# Patient Record
Sex: Male | Born: 1975
Health system: Southern US, Community
[De-identification: ages and names within clinical notes are randomized; demographics above are authoritative.]

## PROBLEM LIST (undated history)

## (undated) DIAGNOSIS — Z973 Presence of spectacles and contact lenses: Secondary | ICD-10-CM

## (undated) DIAGNOSIS — Z87898 Personal history of other specified conditions: Secondary | ICD-10-CM

## (undated) HISTORY — DX: Personal history of other specified conditions: Z87.898

## (undated) HISTORY — DX: Presence of spectacles and contact lenses: Z97.3

---
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
## 2011-10-28 ENCOUNTER — Ambulatory Visit (INDEPENDENT_AMBULATORY_CARE_PROVIDER_SITE_OTHER): Payer: 59 | Admitting: Medical

## 2011-10-28 ENCOUNTER — Encounter: Payer: Self-pay | Admitting: Medical

## 2011-10-28 VITALS — BP 118/80 | HR 60 | Temp 98.0°F | Resp 18 | Ht 73.5 in | Wt 202.0 lb

## 2011-10-28 DIAGNOSIS — Z0000 Encounter for general adult medical examination without abnormal findings: Secondary | ICD-10-CM

## 2011-10-28 DIAGNOSIS — Z8249 Family history of ischemic heart disease and other diseases of the circulatory system: Secondary | ICD-10-CM

## 2011-10-28 DIAGNOSIS — M25512 Pain in left shoulder: Secondary | ICD-10-CM

## 2011-10-28 DIAGNOSIS — M7660 Achilles tendinitis, unspecified leg: Secondary | ICD-10-CM

## 2011-10-28 DIAGNOSIS — M25519 Pain in unspecified shoulder: Secondary | ICD-10-CM

## 2011-10-28 LAB — LIPID PANEL
LDL Cholesterol: 143 mg/dL — ABNORMAL HIGH (ref 0–99)
Total CHOL/HDL Ratio: 3.3 Ratio
Triglycerides: 58 mg/dL (ref <150)
VLDL: 12 mg/dL (ref 0–40)

## 2011-10-28 LAB — COMPREHENSIVE METABOLIC PANEL
AST: 28 U/L (ref 0–37)
Alkaline Phosphatase: 41 U/L (ref 39–117)
BUN: 10 mg/dL (ref 6–23)
Creat: 0.98 mg/dL (ref 0.50–1.35)

## 2011-10-28 LAB — POCT URINALYSIS DIPSTICK
Blood, UA: NEGATIVE
Glucose, UA: NEGATIVE
Nitrite, UA: NEGATIVE
Protein, UA: NEGATIVE
Urobilinogen, UA: NEGATIVE
pH, UA: 7

## 2011-10-28 LAB — CBC WITH DIFFERENTIAL/PLATELET
Basophils Relative: 1 % (ref 0–1)
Eosinophils Absolute: 0.4 10*3/uL (ref 0.0–0.7)
HCT: 43.9 % (ref 39.0–52.0)
Hemoglobin: 15.6 g/dL (ref 13.0–17.0)
Lymphs Abs: 2.8 10*3/uL (ref 0.7–4.0)
MCH: 30.2 pg (ref 26.0–34.0)
MCHC: 35.5 g/dL (ref 30.0–36.0)
Monocytes Absolute: 0.7 10*3/uL (ref 0.1–1.0)
Monocytes Relative: 13 % — ABNORMAL HIGH (ref 3–12)
Neutro Abs: 1.9 10*3/uL (ref 1.7–7.7)

## 2011-10-28 NOTE — Progress Notes (Signed)
Subjective:   HPI  Walter Carter is a 36 y.o. male who presents for a complete physical.  New patient today.  He recently graduated form nursing school, working at [employer].  Been in good health, but having issues with left shoulder and achilles.   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  Saw ortho, had injection of steroid and PT.   Using home PT exercises now, but still having problems. Denies fall, trauma or injury.  He also has left achilles pain.   Recently started running 1 mi several days per week, but every time he goes to 2 miles or increases, gets pain.    He has family history of heart disease, wants things checked out.   Reviewed their medical, surgical, family, social, medication, and allergy history and updated chart as appropriate.  Past Medical History  Diagnosis Date  . Wears glasses     alternates some with contacts    Past Surgical History  Procedure Date  . Knee surgery 1988    right for patella fracture and osteophytic growths  . Knee surgery 1991    right knee to remove hardware from prior surgery    Family History  Problem Relation Age of Onset  . Hypertension Mother   . Heart disease Mother 82    ASD, stenting  . Heart disease Maternal Uncle   . Stroke Maternal Grandmother   . Cancer Maternal Grandmother     breast  . Diabetes Maternal Grandfather   . Heart disease Maternal Grandfather   . Dementia Paternal Grandmother     History   Social History  . Marital Status: Divorced    Spouse Name: N/A    Number of Children: N/A  . Years of Education: N/A   Occupational History  . nurse [employer]   Social History Main Topics  . Smoking status: Never Smoker   . Smokeless tobacco: Not on file  . Alcohol Use: 0.6 oz/week    1 Cans of beer per week  . Drug Use: No  . Sexually Active: Not on file   Other Topics Concern   . Not on file   Social History Narrative   Graduated 2013 [employer] for nursing.  Prior worked in group homes.  Single, divorced, has girlfriend x 8 years, has 4 children    No current outpatient prescriptions on file prior to visit.    No Known Allergies    Review of Systems Constitutional: -fever, -chills, -sweats, -unexpected weight change, -anorexia, -fatigue Allergy: -sneezing, -itching, -congestion Dermatology: denies changing moles, rash, lumps, new worrisome lesions ENT: -runny nose, -ear pain, -sore throat, -hoarseness, -sinus pain, -teeth pain, -tinnitus, -hearing loss, -epistaxis Cardiology:  -chest pain, -palpitations, -edema, -orthopnea, -paroxysmal nocturnal dyspnea Respiratory: -cough, -shortness of breath, -dyspnea on exertion, -wheezing, -hemoptysis Gastroenterology: -abdominal pain, -nausea, -vomiting, -diarrhea, -constipation, -blood in stool, -changes in bowel movement, -dysphagia Hematology: -bleeding or bruising problems Musculoskeletal: -arthralgias, +myalgias, -joint swelling, -back pain, -neck pain, -cramping, -gait changes Ophthalmology: -vision changes, -eye redness, -itching, -discharge Urology: -dysuria, -difficulty urinating, -hematuria, -urinary frequency, -urgency, incontinence Neurology: -headache, -weakness, -tingling, -numbness, -speech abnormality, -memory loss, -falls, -dizziness Psychology:  -depressed mood, -agitation, -sleep problems         Objective:   Physical Exam  Filed Vitals:   10/28/11 1054  BP: 118/80  Pulse: 60  Temp: 98  F (36.7 C)  Resp: 18    General appearance: alert, no distress, WD/WN, tall AA male Skin: scattered benign appearing macules, no worrisome lesions HEENT: normocephalic, conjunctiva/corneas normal, sclerae anicteric, PERRLA, EOMi, nares patent, no discharge or erythema, pharynx normal Oral cavity: MMM, tongue normal, missing some molars, otherwise teeth in good repair Neck: supple, no lymphadenopathy, no  thyromegaly, no masses, normal ROM, on bruits Chest: non tender, normal shape and expansion Heart: RRR, normal S1, S2, no murmurs Lungs: CTA bilaterally, no wheezes, rhonchi, or rales Abdomen: +bs, soft, non tender, non distended, no masses, no hepatomegaly, no splenomegaly, no bruits Back: non tender, normal ROM, no scoliosis Musculoskeletal:horizontal surgical scare over right knee, infrapatella, left shoulder with mild pain with resisted empty can and hawkins and apprehension test, but otherwise upper extremities non tender, no obvious deformity, normal ROM throughout, lower extremities non tender, no obvious deformity, normal ROM throughout Extremities: no edema, no cyanosis, no clubbing Pulses: 2+ symmetric, upper and lower extremities, normal cap refill Neurological: alert, oriented x 3, CN2-12 intact, strength normal upper extremities and lower extremities, sensation normal throughout, DTRs 2+ throughout, no cerebellar signs, gait normal Psychiatric: normal affect, behavior normal, pleasant  GU: normal male external genitalia, nontender, no masses, no hernia, no lymphadenopathy Rectal: deferred   Adult ECG Report  Indication: family history of heart disease, physical  Rate: 66bpm  Rhythm: normal sinus rhythm  QRS Axis: 71 degrees  PR Interval:  QRS Duration: 86ms  QTc:  Conduction Disturbances: none  Other Abnormalities: none  Patient's cardiac risk factors are: family history of premature cardiovascular disease and male gender.  EKG comparison: none  Narrative Interpretation: normal EKG    Assessment and Plan :    Encounter Diagnoses  Name Primary?  . Routine general medical examination at a health care facility Yes  . Family history of coronary artery disease   . Shoulder pain, left   . Achilles tendinitis      Physical exam - discussed healthy lifestyle, diet, exercise, preventative care, vaccinations, and addressed their concerns.  He will get influenza  vaccine through work, Tdap was within last 3 years.  Family hx/o CAD - EKG today, labs today for baseline evaluation of risks.  He requested EKG today.  Shoulder pain - etiology unclear, possible rotator cuff issue.  C/t exercises as therapy showed him, and f/u with orthopedics if not improving.   Achilles tendonitis - Aleve prn, use gradual increase in exercise and running instead of big changes in duration of exercise, ice and elevation prn.   Let me know if not improving.  Follow-up pending labs

## 2011-10-28 NOTE — Patient Instructions (Addendum)
Preventative Care for Adults, Male       REGULAR HEALTH EXAMS:  A routine yearly physical is a good way to check in with your primary care provider about your health and preventive screening. It is also an opportunity to share updates about your health and any concerns you have, and receive a thorough all-over exam.   Most health insurance companies pay for at least some preventative services.  Check with your health plan for specific coverages.  WHAT PREVENTATIVE SERVICES DO MEN NEED?  Adult men should have their weight and blood pressure checked regularly.   Men age 35 and older should have their cholesterol levels checked regularly.  Beginning at age 50 and continuing to age 75, men should be screened for colorectal cancer.  Certain people should may need continued testing until age 85.  Other cancer screening may include exams for testicular and prostate cancer.  Updating vaccinations is part of preventative care.  Vaccinations help protect against diseases such as the flu.  Lab tests are generally done as part of preventative care to screen for anemia and blood disorders, to screen for problems with the kidneys and liver, to screen for bladder problems, to check blood sugar, and to check your cholesterol level.  Preventative services generally include counseling about diet, exercise, avoiding tobacco, drugs, excessive alcohol consumption, and sexually transmitted infections.    GENERAL RECOMMENDATIONS FOR GOOD HEALTH:  Healthy diet:  Eat a variety of foods, including fruit, vegetables, animal or vegetable protein, such as meat, fish, chicken, and eggs, or beans, lentils, tofu, and grains, such as rice.  Drink plenty of water daily.  Decrease saturated fat in the diet, avoid lots of red meat, processed foods, sweets, fast foods, and fried foods.  Exercise:  Aerobic exercise helps maintain good heart health. At least 30-40 minutes of moderate-intensity exercise is recommended.  For example, a brisk walk that increases your heart rate and breathing. This should be done on most days of the week.   Find a type of exercise or a variety of exercises that you enjoy so that it becomes a part of your daily life.  Examples are running, walking, swimming, water aerobics, and biking.  For motivation and support, explore group exercise such as aerobic class, spin class, Zumba, Yoga,or  martial arts, etc.    Set exercise goals for yourself, such as a certain weight goal, walk or run in a race such as a 5k walk/run.  Speak to your primary care provider about exercise goals.  Disease prevention:  If you smoke or chew tobacco, find out from your caregiver how to quit. It can literally save your life, no matter how long you have been a tobacco user. If you do not use tobacco, never begin.   Maintain a healthy diet and normal weight. Increased weight leads to problems with blood pressure and diabetes.   The Body Mass Index or BMI is a way of measuring how much of your body is fat. Having a BMI above 27 increases the risk of heart disease, diabetes, hypertension, stroke and other problems related to obesity. Your caregiver can help determine your BMI and based on it develop an exercise and dietary program to help you achieve or maintain this important measurement at a healthful level.  High blood pressure causes heart and blood vessel problems.  Persistent high blood pressure should be treated with medicine if weight loss and exercise do not work.   Fat and cholesterol leaves deposits in your arteries   that can block them. This causes heart disease and vessel disease elsewhere in your body.  If your cholesterol is found to be high, or if you have heart disease or certain other medical conditions, then you may need to have your cholesterol monitored frequently and be treated with medication.   Ask if you should have a stress test if your history suggests this. A stress test is a test done on  a treadmill that looks for heart disease. This test can find disease prior to there being a problem.  Avoid drinking alcohol in excess (more than two drinks per day).  Avoid use of street drugs. Do not share needles with anyone. Ask for professional help if you need assistance or instructions on stopping the use of alcohol, cigarettes, and/or drugs.  Brush your teeth twice a day with fluoride toothpaste, and floss once a day. Good oral hygiene prevents tooth decay and gum disease. The problems can be painful, unattractive, and can cause other health problems. Visit your dentist for a routine oral and dental check up and preventive care every 6-12 months.   Look at your skin regularly.  Use a mirror to look at your back. Notify your caregivers of changes in moles, especially if there are changes in shapes, colors, a size larger than a pencil eraser, an irregular border, or development of new moles.  Safety:  Use seatbelts 100% of the time, whether driving or as a passenger.  Use safety devices such as hearing protection if you work in environments with loud noise or significant background noise.  Use safety glasses when doing any work that could send debris in to the eyes.  Use a helmet if you ride a bike or motorcycle.  Use appropriate safety gear for contact sports.  Talk to your caregiver about gun safety.  Use sunscreen with a SPF (or skin protection factor) of 15 or greater.  Lighter skinned people are at a greater risk of skin cancer. Don't forget to also wear sunglasses in order to protect your eyes from too much damaging sunlight. Damaging sunlight can accelerate cataract formation.   Practice safe sex. Use condoms. Condoms are used for birth control and to help reduce the spread of sexually transmitted infections (or STIs).  Some of the STIs are gonorrhea (the clap), chlamydia, syphilis, trichomonas, herpes, HPV (human papilloma virus) and HIV (human immunodeficiency virus) which causes AIDS.  The herpes, HIV and HPV are viral illnesses that have no cure. These can result in disability, cancer and death.   Keep carbon monoxide and smoke detectors in your home functioning at all times. Change the batteries every 6 months or use a model that plugs into the wall.   Vaccinations:  Stay up to date with your tetanus shots and other required immunizations. You should have a booster for tetanus every 10 years. Be sure to get your flu shot every year, since 5%-20% of the [place] population comes down with the flu. The flu vaccine changes each year, so being vaccinated once is not enough. Get your shot in the fall, before the flu season peaks.   Other vaccines to consider:  Pneumococcal vaccine to protect against certain types of pneumonia.  This is normally recommended for adults age 65 or older.  However, adults younger than 36 years old with certain underlying conditions such as diabetes, heart or lung disease should also receive the vaccine.  Shingles vaccine to protect against Varicella Zoster if you are older than age 60, or younger   than 36 years old with certain underlying illness.  Hepatitis A vaccine to protect against a form of infection of the liver by a virus acquired from food.  Hepatitis B vaccine to protect against a form of infection of the liver by a virus acquired from blood or body fluids, particularly if you work in health care.  If you plan to travel internationally, check with your local health department for specific vaccination recommendations.  Cancer Screening:  Most routine colon cancer screening begins at the age of 44. On a yearly basis, doctors may provide special easy to use take-home tests to check for hidden blood in the stool. Sigmoidoscopy or colonoscopy can detect the earliest forms of colon cancer and is life saving. These tests use a small camera at the end of a tube to directly examine the colon. Speak to your caregiver about this at age 48, when routine  screening begins (and is repeated every 5 years unless early forms of pre-cancerous polyps or small growths are found).   At the age of 65 men usually start screening for prostate cancer every year. Screening may begin at a younger age for those with higher risk. Those at higher risk include African-Americans or having a family history of prostate cancer. There are two types of tests for prostate cancer:   Prostate-specific antigen (PSA) testing. Recent studies raise questions about prostate cancer using PSA and you should discuss this with your caregiver.   Digital rectal exam (in which your doctor's lubricated and gloved finger feels for enlargement of the prostate through the anus).   Screening for testicular cancer.  Do a monthly exam of your testicles. Gently roll each testicle between your thumb and fingers, feeling for any abnormal lumps. The best time to do this is after a hot shower or bath when the tissues are looser. Notify your caregivers of any lumps, tenderness or changes in size or shape immediately.    Achilles Tendinitis Tendinitis a swelling and soreness of the tendon. The pain in the tendon (cord-like structure which attaches muscle to bone) is produced by tiny tears and the inflammation present in that tendon. It commonly occurs at the shoulders, heels, and elbows. It is usually caused by overusing the tendon and joint involved. Achilles tendinitis involves the Achilles tendon. This is the large tendon in the back of the leg just above the foot. It attaches the large muscles of the lower leg to the heel bone (called calcaneus).  This diagnosis (learning what is wrong) is made by examination. X-rays will be generally be normal if only tendinitis is present. HOME CARE INSTRUCTIONS   Apply ice to the injury for 15 to 20 minutes, 3 to 4 times per day. Put the ice in a plastic bag and place a towel between the bag of ice and your skin.  Try to avoid use other than gentle range of  motion while the tendon is painful. Do not resume use until instructed by your caregiver. Then begin use gradually. Do not increase use to the point of pain. If pain does develop, decrease use and continue the above measures. Gradually increase activities that do not cause discomfort until you gradually achieve normal use.  Only take over-the-counter or prescription medicines for pain, discomfort, or fever as directed by your caregiver. SEEK MEDICAL CARE IF:   Your pain and swelling increase or pain is uncontrolled with medications.  You develop new, unexplained problems (symptoms) or an increase of the symptoms that brought you to  your caregiver.  You develop an inability to move your toes or foot, develop warmth and swelling in your foot, or begin running an unexplained temperature. MAKE SURE YOU:   Understand these instructions.  Will watch your condition.  Will get help right away if you are not doing well or get worse. Document Released: 10/06/2004 Document Revised: 03/21/2011 Document Reviewed: 08/15/2007 [facility] Patient Information 2013 [place], [place].

## 2012-01-11 HISTORY — PX: SHOULDER ARTHROSCOPY W/ LABRAL REPAIR: SHX2399

## 2012-02-25 ENCOUNTER — Other Ambulatory Visit: Payer: Self-pay

## 2012-05-10 ENCOUNTER — Ambulatory Visit (INDEPENDENT_AMBULATORY_CARE_PROVIDER_SITE_OTHER): Payer: PRIVATE HEALTH INSURANCE | Admitting: Family Medicine

## 2012-05-10 ENCOUNTER — Ambulatory Visit
Admission: RE | Admit: 2012-05-10 | Discharge: 2012-05-10 | Disposition: A | Discharge status: Home / Self Care | Payer: PRIVATE HEALTH INSURANCE | Source: Ambulatory Visit | Attending: Family Medicine | Admitting: Family Medicine

## 2012-05-10 ENCOUNTER — Encounter: Payer: Self-pay | Admitting: Family Medicine

## 2012-05-10 VITALS — BP 120/70 | HR 74 | Wt 202.0 lb

## 2012-05-10 DIAGNOSIS — R109 Unspecified abdominal pain: Secondary | ICD-10-CM

## 2012-05-10 NOTE — Progress Notes (Signed)
  Subjective:    Patient ID: Walter Carter, male    DOB: December 05, 1975, 37 y.o.   MRN: [id number]  HPI Has a two-week history of left flank pain. The pain is dull and constant while sedentary but with motion does get worse. He has made no progress in the last 2 weeks. He has no history of overuse or injury. No numbness, tingling or weakness in his lower extremity. He has tried muscle relaxer and anti-inflammatory medications but still is having difficulty.   Review of Systems     Objective:   Physical Exam Alert and in no distress. Some tenderness to palpation in the left CVA. Good motion of the back in all directions. No tenderness over ribs or lumbar spine.       Assessment & Plan:  Left flank pain - Plan: DG Lumbar Spine Complete discussed followup after the x-ray including physical therapy and/or chiropractic.

## 2012-05-11 NOTE — Progress Notes (Signed)
Quick Note:  CALLED PT TO INFORM HIM OF XRAY RESULTS WIFE INFORMED THEY WILL CALL ME BACK TO LET ME KNOW WHERE HE WOULD LIKE TO HAVE P/T DONE ______

## 2012-10-05 DIAGNOSIS — M25561 Pain in right knee: Secondary | ICD-10-CM | POA: Insufficient documentation

## 2012-10-05 DIAGNOSIS — M2241 Chondromalacia patellae, right knee: Secondary | ICD-10-CM | POA: Insufficient documentation

## 2012-10-26 DIAGNOSIS — M25512 Pain in left shoulder: Secondary | ICD-10-CM | POA: Insufficient documentation

## 2012-11-15 ENCOUNTER — Other Ambulatory Visit: Payer: Self-pay

## 2012-11-27 DIAGNOSIS — S43439A Superior glenoid labrum lesion of unspecified shoulder, initial encounter: Secondary | ICD-10-CM | POA: Insufficient documentation

## 2012-11-27 DIAGNOSIS — M7550 Bursitis of unspecified shoulder: Secondary | ICD-10-CM | POA: Insufficient documentation

## 2012-12-21 ENCOUNTER — Encounter: Payer: Self-pay | Admitting: Medical

## 2012-12-21 ENCOUNTER — Ambulatory Visit (INDEPENDENT_AMBULATORY_CARE_PROVIDER_SITE_OTHER): Payer: PRIVATE HEALTH INSURANCE | Admitting: Medical

## 2012-12-21 VITALS — BP 110/70 | HR 80 | Temp 98.3°F | Resp 16 | Wt 200.0 lb

## 2012-12-21 DIAGNOSIS — Z01818 Encounter for other preprocedural examination: Secondary | ICD-10-CM

## 2012-12-21 LAB — CBC WITH DIFFERENTIAL/PLATELET
Eosinophils Absolute: 0.3 10*3/uL (ref 0.0–0.7)
Eosinophils Relative: 3 % (ref 0–5)
HCT: 44.4 % (ref 39.0–52.0)
Hemoglobin: 16 g/dL (ref 13.0–17.0)
Lymphocytes Relative: 35 % (ref 12–46)
Lymphs Abs: 2.9 10*3/uL (ref 0.7–4.0)
MCH: 31.2 pg (ref 26.0–34.0)
MCV: 86.5 fL (ref 78.0–100.0)
Monocytes Relative: 13 % — ABNORMAL HIGH (ref 3–12)
Neutrophils Relative %: 48 % (ref 43–77)
RBC: 5.13 MIL/uL (ref 4.22–5.81)
WBC: 8.3 10*3/uL (ref 4.0–10.5)

## 2012-12-21 LAB — APTT: aPTT: 42 seconds — ABNORMAL HIGH (ref 24–37)

## 2012-12-21 LAB — PROTIME-INR: Prothrombin Time: 14.7 seconds (ref 11.6–15.2)

## 2012-12-21 LAB — COMPREHENSIVE METABOLIC PANEL
Alkaline Phosphatase: 62 U/L (ref 39–117)
BUN: 9 mg/dL (ref 6–23)
CO2: 27 mEq/L (ref 19–32)
Glucose, Bld: 95 mg/dL (ref 70–99)
Total Bilirubin: 1.2 mg/dL (ref 0.3–1.2)

## 2012-12-21 NOTE — Progress Notes (Signed)
Subjective: Here today for surgery clearance. Will be having left shoulder surgery soon, 01/07/13.  Has biceps tendinosis, labral tear, avulsion injury of left shoulder.  Will be having surgery at [facility], Dr. Iona Beard.  Has had left shoulder problems x 3 years, earlier in the year saw ortho in [place], had 2 rounds of PT, but shoulder worsened to the point he saw different ortho at [facility].    He has been in usual state of health. No current health problems.  Had a little cold recently but that has resolved.   He does recall with knee surgery in the past, was harder to awake from anesthesia.  Was "sensitive to anesthesia and narcotics".    Past Medical History  Diagnosis Date  . Wears glasses     alternates some with contacts  . H/O urinary retention     s/t narcotics with prior surgery    Past Surgical History  Procedure Laterality Date  . Knee surgery  1988    right for patella fracture and osteophytic growths  . Knee surgery  1991    right knee to remove hardware from prior surgery    History   Social History  . Marital Status: Divorced    Spouse Name: N/A    Number of Children: N/A  . Years of Education: N/A   Occupational History  . nurse [employer]   Social History Main Topics  . Smoking status: Never Smoker   . Smokeless tobacco: Not on file  . Alcohol Use: 0.6 oz/week    1 Cans of beer per week  . Drug Use: No  . Sexual Activity: Not on file   Other Topics Concern  . 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  Prior worked in group homes.  Single, divorced, has girlfriend x 9 years, has 4 children.  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  Exercise - jogging some.  Was exercising more before the left shoulder injury.    Family History  Problem Relation Age of Onset  . Hypertension Mother   . Heart disease Mother 31    ASD, stenting  . Heart disease Maternal Uncle   .  Stroke Maternal Grandmother   . Cancer Maternal Grandmother     breast  . Diabetes Maternal Grandfather   . Heart disease Maternal Grandfather   . Dementia Paternal Grandmother   . Other Father     rare hematology disorder, unknown?    Current outpatient prescriptions:fish oil-omega-3 fatty acids 1000 MG capsule, Take 2 g by mouth daily., Disp: , Rfl: ;  Multiple Vitamin (MULTIVITAMIN) capsule, Take 1 capsule by mouth daily., Disp: , Rfl:   No Known Allergies   Review of Systems Constitutional: -fever, -chills, -sweats, -unexpected weight change,-fatigue ENT: -runny nose, -ear pain, -sore throat Cardiology:  -chest pain, -palpitations, -edema Respiratory: -cough, -shortness of breath, -wheezing Gastroenterology: -abdominal pain, -nausea, -vomiting, -diarrhea, -constipation Hematology: -bleeding or bruising problems Ophthalmology: -vision changes Urology: -dysuria, -difficulty urinating, -hematuria, -urinary frequency, -urgency Neurology: -headache, -weakness, -tingling, -numbness      Objective:   Physical Exam  BP 110/70  Pulse 80  Temp(Src) 98.3 F (36.8 C) (Oral)  Resp 16  Wt 200 lb (90.719 kg)  General appearance: alert, no distress, WD/WN, lean AA male, pleasant Skin: warm, lower back centrally with 8mm round brown lesion appears to be a scar, no other worrisome lesions HEENT: normocephalic, conjunctiva/corneas  normal, sclerae anicteric, PERRLA, EOMi, nares patent, no discharge or erythema, pharynx normal Oral cavity: MMM, tongue normal, teeth normal Neck: supple, no lymphadenopathy, no thyromegaly, no masses, normal ROM, no bruits Chest: non tender, normal shape and expansion Heart: RRR, normal S1, S2, no murmurs Lungs: CTA bilaterally, no wheezes, rhonchi, or rales Abdomen: +bs, soft, non tender, non distended, no masses, no hepatomegaly, no splenomegaly, no bruits Back: non tender, normal ROM, no scoliosis Musculoskeletal:left shoulder not examined, otherwise  surgical horizontal scar right knee, left upper lateral hip with old linear scar from laceration, upper extremities non tender, no obvious deformity, normal ROM throughout, lower extremities non tender, no obvious deformity, normal ROM throughout Extremities: no edema, no cyanosis, no clubbing Pulses: 2+ symmetric, upper and lower extremities, normal cap refill Neurological: alert, oriented x 3, CN2-12 intact, strength normal upper extremities and lower extremities, sensation normal throughout, DTRs 2+ throughout, no cerebellar signs, gait normal Psychiatric: normal affect, behavior normal, pleasant  GU: normal male external genitalia, somewhat hypogonadal on exam, circumcised, nontender, no masses, no hernia, no lymphadenopathy Rectal: anus normal tone, prostate WNL   Assessment and Plan :    Encounter Diagnosis  Name Primary?  . Preop examination Yes     Physical exam - discussed healthy lifestyle, diet, exercise, preventative care, vaccinations, and addressed their concerns.   Reviewed prior labs, EKG, labs today.  I feel he is low risk for surgery.  Will fax records to orthopedic surgery when he calls back with contact info.  Follow-up pending labs

## 2012-12-24 ENCOUNTER — Other Ambulatory Visit: Payer: Self-pay | Admitting: Medical

## 2013-01-14 DIAGNOSIS — Z9889 Other specified postprocedural states: Secondary | ICD-10-CM | POA: Insufficient documentation

## 2014-09-13 ENCOUNTER — Emergency Department (HOSPITAL_COMMUNITY): Payer: PRIVATE HEALTH INSURANCE

## 2014-09-13 ENCOUNTER — Emergency Department (HOSPITAL_COMMUNITY)
Admission: EM | Admit: 2014-09-13 | Discharge: 2014-09-13 | Disposition: A | Discharge status: Home / Self Care | Payer: PRIVATE HEALTH INSURANCE | Attending: Emergency Medicine | Admitting: Emergency Medicine

## 2014-09-13 ENCOUNTER — Encounter (HOSPITAL_COMMUNITY): Payer: Self-pay | Admitting: Emergency Medicine

## 2014-09-13 DIAGNOSIS — S86012A Strain of left Achilles tendon, initial encounter: Secondary | ICD-10-CM

## 2014-09-13 DIAGNOSIS — X58XXXA Exposure to other specified factors, initial encounter: Secondary | ICD-10-CM | POA: Insufficient documentation

## 2014-09-13 DIAGNOSIS — Y9367 Activity, basketball: Secondary | ICD-10-CM | POA: Insufficient documentation

## 2014-09-13 DIAGNOSIS — Y92310 Basketball court as the place of occurrence of the external cause: Secondary | ICD-10-CM | POA: Insufficient documentation

## 2014-09-13 DIAGNOSIS — S93402A Sprain of unspecified ligament of left ankle, initial encounter: Secondary | ICD-10-CM | POA: Insufficient documentation

## 2014-09-13 DIAGNOSIS — Y998 Other external cause status: Secondary | ICD-10-CM | POA: Insufficient documentation

## 2014-09-13 MED ORDER — OXYCODONE-ACETAMINOPHEN 5-325 MG PO TABS: 1.0000 | ORAL_TABLET | ORAL | Status: DC | PRN

## 2014-09-13 MED ORDER — IBUPROFEN 800 MG PO TABS: 800.0000 mg | ORAL_TABLET | Freq: Three times a day (TID) | ORAL | Status: DC

## 2014-09-13 NOTE — Progress Notes (Signed)
Orthopedic Tech Progress Note Patient Details:  Walter Carter April 27, 1975 [id number]  Ortho Devices Type of Ortho Device: CAM walker, Crutches Ortho Device/Splint Location: lle Ortho Device/Splint Interventions: Application   Mattia Liford 09/13/2014, 1:37 PM

## 2014-09-13 NOTE — ED Notes (Signed)
Declined W/C at D/C and was escorted to lobby by RN. 

## 2014-09-13 NOTE — ED Provider Notes (Addendum)
Complaint of pain over right Achilles tendon occurred this morning while playing basketball. Patient states his son landed on him on exam patient is no distress right lower extremity without swelling no deformity positive Thompson's test DP pulse 2+ good capillary refill. Minimally tender overlying Achilles tendon Suspect Achilles tendon rupture  Orlie Dakin, MD 09/13/14 [place], MD 10/24/14 6203

## 2014-09-13 NOTE — ED Notes (Signed)
Pt said he was playing basketball and his son landed on his ankle this morning.

## 2014-09-15 ENCOUNTER — Encounter (HOSPITAL_COMMUNITY)
Admission: RE | Disposition: A | Discharge status: Home / Self Care | Payer: Self-pay | Source: Ambulatory Visit | Attending: Orthopedic Surgery

## 2014-09-15 ENCOUNTER — Inpatient Hospital Stay (HOSPITAL_COMMUNITY): Payer: PRIVATE HEALTH INSURANCE | Admitting: Anesthesiology

## 2014-09-15 ENCOUNTER — Ambulatory Visit (HOSPITAL_COMMUNITY)
Admission: RE | Admit: 2014-09-15 | Discharge: 2014-09-15 | Disposition: A | Discharge status: Home / Self Care | Payer: PRIVATE HEALTH INSURANCE | Source: Ambulatory Visit | Attending: Orthopedic Surgery | Admitting: Orthopedic Surgery

## 2014-09-15 DIAGNOSIS — Y9367 Activity, basketball: Secondary | ICD-10-CM | POA: Diagnosis not present

## 2014-09-15 DIAGNOSIS — Y929 Unspecified place or not applicable: Secondary | ICD-10-CM | POA: Insufficient documentation

## 2014-09-15 DIAGNOSIS — S86012A Strain of left Achilles tendon, initial encounter: Secondary | ICD-10-CM | POA: Diagnosis present

## 2014-09-15 DIAGNOSIS — W03XXXA Other fall on same level due to collision with another person, initial encounter: Secondary | ICD-10-CM | POA: Diagnosis not present

## 2014-09-15 HISTORY — PX: ACHILLES TENDON SURGERY: SHX542

## 2014-09-15 SURGERY — REPAIR, TENDON, ACHILLES
Anesthesia: Regional | Laterality: Left

## 2014-09-15 MED ORDER — MIDAZOLAM HCL 2 MG/2ML IJ SOLN
INTRAMUSCULAR | Status: AC
  Filled 2014-09-15: qty 4

## 2014-09-15 MED ORDER — PROPOFOL 10 MG/ML IV BOLUS
INTRAVENOUS | Status: DC | PRN
  Administered 2014-09-15: 200 mg via INTRAVENOUS
  Administered 2014-09-15: 50 mg via INTRAVENOUS

## 2014-09-15 MED ORDER — ONDANSETRON HCL 4 MG/2ML IJ SOLN
INTRAMUSCULAR | Status: DC | PRN
  Administered 2014-09-15: 4 mg via INTRAVENOUS

## 2014-09-15 MED ORDER — MIDAZOLAM HCL 5 MG/5ML IJ SOLN
INTRAMUSCULAR | Status: DC | PRN
  Administered 2014-09-15: 2 mg via INTRAVENOUS

## 2014-09-15 MED ORDER — CEFAZOLIN SODIUM-DEXTROSE 2-3 GM-% IV SOLR
INTRAVENOUS | Status: DC | PRN
  Administered 2014-09-15: 2 g via INTRAVENOUS

## 2014-09-15 MED ORDER — OXYCODONE HCL 5 MG/5ML PO SOLN: 5.0000 mg | Freq: Once | ORAL | Status: DC | PRN

## 2014-09-15 MED ORDER — 0.9 % SODIUM CHLORIDE (POUR BTL) OPTIME
TOPICAL | Status: DC | PRN
  Administered 2014-09-15: 1000 mL

## 2014-09-15 MED ORDER — PROPOFOL 10 MG/ML IV BOLUS
INTRAVENOUS | Status: AC
  Filled 2014-09-15: qty 20

## 2014-09-15 MED ORDER — ONDANSETRON HCL 4 MG/2ML IJ SOLN
INTRAMUSCULAR | Status: AC
  Filled 2014-09-15: qty 2

## 2014-09-15 MED ORDER — HYDROMORPHONE HCL 1 MG/ML IJ SOLN: 0.2500 mg | INTRAMUSCULAR | Status: DC | PRN

## 2014-09-15 MED ORDER — PROMETHAZINE HCL 25 MG/ML IJ SOLN
6.2500 mg | INTRAMUSCULAR | Status: DC | PRN
Start: 2014-09-15 — End: 2014-09-15

## 2014-09-15 MED ORDER — BUPIVACAINE-EPINEPHRINE (PF) 0.5% -1:200000 IJ SOLN
INTRAMUSCULAR | Status: DC | PRN
  Administered 2014-09-15: 20 mL via PERINEURAL
  Administered 2014-09-15: 25 mL

## 2014-09-15 MED ORDER — LACTATED RINGERS IV SOLN
INTRAVENOUS | Status: DC | PRN
  Administered 2014-09-15 (×2): via INTRAVENOUS

## 2014-09-15 MED ORDER — FENTANYL CITRATE (PF) 250 MCG/5ML IJ SOLN
INTRAMUSCULAR | Status: AC
  Filled 2014-09-15: qty 5

## 2014-09-15 MED ORDER — KETOROLAC TROMETHAMINE 30 MG/ML IJ SOLN: 30.0000 mg | Freq: Once | INTRAMUSCULAR | Status: DC

## 2014-09-15 MED ORDER — OXYCODONE HCL 5 MG PO TABS: 5.0000 mg | ORAL_TABLET | Freq: Once | ORAL | Status: DC | PRN

## 2014-09-15 MED ORDER — FENTANYL CITRATE (PF) 100 MCG/2ML IJ SOLN
INTRAMUSCULAR | Status: DC | PRN
  Administered 2014-09-15 (×3): 50 ug via INTRAVENOUS

## 2014-09-15 SURGICAL SUPPLY — 37 items
BNDG COHESIVE 4X5 TAN STRL (GAUZE/BANDAGES/DRESSINGS) ×3 IMPLANT
BNDG COHESIVE 6X5 TAN STRL LF (GAUZE/BANDAGES/DRESSINGS) ×6 IMPLANT
BNDG ESMARK 4X9 LF (GAUZE/BANDAGES/DRESSINGS) IMPLANT
CANISTER SUCTION 2500CC (MISCELLANEOUS) ×3 IMPLANT
COVER SURGICAL LIGHT HANDLE (MISCELLANEOUS) ×6 IMPLANT
CUFF TOURNIQUET SINGLE 34IN LL (TOURNIQUET CUFF) ×3 IMPLANT
DRAPE INCISE IOBAN 66X45 STRL (DRAPES) ×3 IMPLANT
DRAPE U-SHAPE 47X51 STRL (DRAPES) ×3 IMPLANT
DRSG ADAPTIC 3X8 NADH LF (GAUZE/BANDAGES/DRESSINGS) ×3 IMPLANT
DURAPREP 26ML APPLICATOR (WOUND CARE) ×3 IMPLANT
ELECT REM PT RETURN 9FT ADLT (ELECTROSURGICAL) ×3
ELECTRODE REM PT RTRN 9FT ADLT (ELECTROSURGICAL) ×1 IMPLANT
GAUZE SPONGE 4X4 12PLY STRL (GAUZE/BANDAGES/DRESSINGS) ×3 IMPLANT
GLOVE BIOGEL PI IND STRL 9 (GLOVE) ×1 IMPLANT
GLOVE BIOGEL PI INDICATOR 9 (GLOVE) ×2
GLOVE SURG ORTHO 9.0 STRL STRW (GLOVE) ×3 IMPLANT
GOWN STRL REUS W/ TWL XL LVL3 (GOWN DISPOSABLE) ×3 IMPLANT
GOWN STRL REUS W/TWL XL LVL3 (GOWN DISPOSABLE) ×6
KIT ROOM TURNOVER OR (KITS) ×3 IMPLANT
NDL SUT .5 MAYO 1.404X.05X (NEEDLE) ×1 IMPLANT
NEEDLE MAYO TAPER (NEEDLE) ×2
NS IRRIG 1000ML POUR BTL (IV SOLUTION) ×3 IMPLANT
PACK ORTHO EXTREMITY (CUSTOM PROCEDURE TRAY) ×3 IMPLANT
PAD ABD 8X10 STRL (GAUZE/BANDAGES/DRESSINGS) ×3 IMPLANT
PAD ARMBOARD 7.5X6 YLW CONV (MISCELLANEOUS) ×6 IMPLANT
PADDING CAST COTTON 6X4 STRL (CAST SUPPLIES) ×3 IMPLANT
SPONGE GAUZE 4X4 12PLY STER LF (GAUZE/BANDAGES/DRESSINGS) ×3 IMPLANT
SPONGE LAP 18X18 X RAY DECT (DISPOSABLE) ×3 IMPLANT
SUT ETHILON 2 0 PSLX (SUTURE) ×6 IMPLANT
SUT FIBERWIRE #2 38 T-5 BLUE (SUTURE) ×12
SUTURE FIBERWR #2 38 T-5 BLUE (SUTURE) ×4 IMPLANT
TOWEL OR 17X24 6PK STRL BLUE (TOWEL DISPOSABLE) ×3 IMPLANT
TOWEL OR 17X26 10 PK STRL BLUE (TOWEL DISPOSABLE) ×3 IMPLANT
TUBE CONNECTING 12'X1/4 (SUCTIONS) ×1
TUBE CONNECTING 12X1/4 (SUCTIONS) ×2 IMPLANT
WATER STERILE IRR 1000ML POUR (IV SOLUTION) ×3 IMPLANT
YANKAUER SUCT BULB TIP NO VENT (SUCTIONS) ×3 IMPLANT

## 2014-09-15 NOTE — H&P (Signed)
Walter Carter is an 39 y.o. male.   Chief Complaint: Left Achilles tendon rupture HPI: Patient is a 39 year old gentleman who was playing basketball he states his son fell in the back of his leg sustaining a rupture of the Achilles.  Past Medical History  Diagnosis Date  . Wears glasses     alternates some with contacts  . H/O urinary retention     s/t narcotics with prior surgery    Past Surgical History  Procedure Laterality Date  . Knee surgery  1988    right for patella fracture and osteophytic growths  . Knee surgery  1991    right knee to remove hardware from prior surgery    Family History  Problem Relation Age of Onset  . Hypertension Mother   . Heart disease Mother 45    ASD, stenting  . Heart disease Maternal Uncle   . Stroke Maternal Grandmother   . Cancer Maternal Grandmother     breast  . Diabetes Maternal Grandfather   . Heart disease Maternal Grandfather   . Dementia Paternal Grandmother   . Other Father     rare hematology disorder, unknown?   Social History:  reports that he has never smoked. He does not have any smokeless tobacco history on file. He reports that he drinks about 0.6 oz of alcohol per week. He reports that he does not use illicit drugs.  Allergies: No Known Allergies  Medications Prior to Admission  Medication Sig Dispense Refill  . fish oil-omega-3 fatty acids 1000 MG capsule Take 2 g by mouth daily.    Marland Kitchen ibuprofen (ADVIL,MOTRIN) 800 MG tablet Take 1 tablet (800 mg total) by mouth 3 (three) times daily. 21 tablet 0  . Multiple Vitamin (MULTIVITAMIN) capsule Take 1 capsule by mouth daily.    Marland Kitchen oxyCODONE-acetaminophen (PERCOCET) 5-325 MG per tablet Take 1 tablet by mouth every 4 (four) hours as needed. 20 tablet 0    No results found for this or any previous visit (from the past 48 hour(s)). Dg Tibia/fibula Left  09/13/2014   CLINICAL DATA:  Posterior left ankle pain. Status post basketball injury.  EXAM: LEFT TIBIA AND FIBULA - 2  VIEW  COMPARISON:  None.  FINDINGS: There is no evidence of fracture or other focal bone lesions. Soft tissues are unremarkable.  IMPRESSION: No acute osseous injury of the left tibia/ fibula.   Electronically Signed   By: Kathreen Devoid   On: 09/13/2014 12:29   Dg Ankle Complete Left  09/13/2014   CLINICAL DATA:  Posterior left ankle pain.  EXAM: LEFT ANKLE COMPLETE - 3+ VIEW  COMPARISON:  None.  FINDINGS: There is no evidence of fracture, dislocation, or joint effusion. There is no evidence of arthropathy or other focal bone abnormality. Soft tissues are unremarkable.  IMPRESSION: Negative.   Electronically Signed   By: Kathreen Devoid   On: 09/13/2014 12:26    Review of Systems  All other systems reviewed and are negative.   There were no vitals taken for this visit. Physical Exam   On examination patient has a palpable defect of the Achilles tendon on the left. Compression of the calf does not reproduce plantar flexion of the foot. Assessment/Plan Assessment: Acute Achilles tendon rupture on the left.  Plan: We will plan for reconstruction of the Achilles tendon. Risks and benefits were discussed including options of nonoperative versus operative treatment. Risks of infection neurovascular injury nonhealing the wound nonhealing of the tendon. Patient states he understands  wishes to proceed at this time.  Roald Lukacs V 09/15/2014, 7:54 AM

## 2014-09-15 NOTE — Op Note (Signed)
09/15/2014  3:56 PM  PATIENT:  Walter Carter    PRE-OPERATIVE DIAGNOSIS:  Left Achilles Rupture  POST-OPERATIVE DIAGNOSIS:  Same  PROCEDURE:  ACHILLES TENDON REPAIR  SURGEON:  Newt Minion, MD  PHYSICIAN ASSISTANT:None ANESTHESIA:   General  PREOPERATIVE INDICATIONS:  Walter Carter is a  39 y.o. male with a diagnosis of Left Achilles Rupture who failed conservative measures and elected for surgical management.    The risks benefits and alternatives were discussed with the patient preoperatively including but not limited to the risks of infection, bleeding, nerve injury, cardiopulmonary complications, the need for revision surgery, among others, and the patient was willing to proceed.  OPERATIVE IMPLANTS: #2 FiberWire 4  OPERATIVE FINDINGS: Complete disruption of the Achilles and plantaris tendon  OPERATIVE PROCEDURE: Patient was brought to the operating room after undergoing a popliteal block he then underwent a general and aesthetic. After adequate levels anesthesia obtained patient was placed supine on the operating table the left lower extremity was prepped using DuraPrep draped into a sterile field. A timeout was called. A longitudinal posterior medial incision was made this carried down through the peritenon. Patient had complete disruption of the Achilles. Using the #2 FiberWire this was woven through the proximal and distal stumps using Krakw technique with 4 strands exiting both the proximal and distal stumps. The sutures were tied mid substance. The patient had good range of motion of the ankle. The wound was irrigated with normal saline. The peritenon and skin were closed using a mallet hour Donati suture technique with 2-0 nylon. The wound was covered with a sterile compressive dressing placed and was placed in a fracture boot extubated taken to the PACU in stable condition.

## 2014-09-15 NOTE — Anesthesia Preprocedure Evaluation (Addendum)
Anesthesia Evaluation  Patient identified by MRN, date of birth, ID band Patient awake    Reviewed: Allergy & Precautions, NPO status , Patient's Chart, lab work & pertinent test results  Airway Mallampati: I  TM Distance: >3 FB Neck ROM: Full    Dental  (+) Teeth Intact   Pulmonary neg pulmonary ROS,  breath sounds clear to auscultation        Cardiovascular negative cardio ROS  Rhythm:Regular Rate:Normal     Neuro/Psych negative neurological ROS     GI/Hepatic negative GI ROS, Neg liver ROS,   Endo/Other  negative endocrine ROS  Renal/GU negative Renal ROS     Musculoskeletal   Abdominal   Peds  Hematology negative hematology ROS (+)   Anesthesia Other Findings   Reproductive/Obstetrics                            Anesthesia Physical Anesthesia Plan  ASA: I  Anesthesia Plan: General and Regional   Post-op Pain Management: GA combined w/ Regional for post-op pain   Induction: Intravenous  Airway Management Planned: LMA  Additional Equipment:   Intra-op Plan:   Post-operative Plan: Extubation in OR  Informed Consent: I have reviewed the patients History and Physical, chart, labs and discussed the procedure including the risks, benefits and alternatives for the proposed anesthesia with the patient or authorized representative who has indicated his/her understanding and acceptance.   Dental advisory given  Plan Discussed with: CRNA  Anesthesia Plan Comments:        Anesthesia Quick Evaluation

## 2014-09-15 NOTE — Anesthesia Postprocedure Evaluation (Signed)
  Anesthesia Post-op Note  Patient: Walter Carter  Procedure(s) Performed: Procedure(s): ACHILLES TENDON REPAIR (Left)  Patient Location: PACU  Anesthesia Type:GA combined with regional for post-op pain  Level of Consciousness: awake and alert   Airway and Oxygen Therapy: Patient Spontanous Breathing  Post-op Pain: none  Post-op Assessment: Post-op Vital signs reviewed LLE Motor Response: Purposeful movement, Responds to commands LLE Sensation: Decreased, No pain, No tingling          Post-op Vital Signs: Reviewed  Last Vitals:  Filed Vitals:   09/15/14 1700  Temp: 86.7 C    Complications: No apparent anesthesia complications

## 2014-09-15 NOTE — Anesthesia Procedure Notes (Addendum)
Anesthesia Regional Block:  Adductor canal block  Pre-Anesthetic Checklist: ,, timeout performed, Correct Patient, Correct Site, Correct Laterality, Correct Procedure, Correct Position, site marked, Risks and benefits discussed,  Surgical consent,  Pre-op evaluation,  At surgeon's request and post-op pain management  Laterality: Left  Prep: chloraprep       Needles:  Injection technique: Single-shot  Needle Type: Echogenic Needle     Needle Length: 9cm 9 cm Needle Gauge: 21 and 21 G    Additional Needles:  Procedures: ultrasound guided (picture in chart) Adductor canal block Narrative:  Start time: 09/15/2014 2:45 PM End time: 09/15/2014 2:52 PM Injection made incrementally with aspirations every 5 mL.  Performed by: Personally  Anesthesiologist: Suzette Battiest   Anesthesia Regional Block:  Popliteal block  Pre-Anesthetic Checklist: ,, timeout performed, Correct Patient, Correct Site, Correct Laterality, Correct Procedure, Correct Position, site marked, Risks and benefits discussed,  Surgical consent,  Pre-op evaluation,  At surgeon's request and post-op pain management  Laterality: Left  Prep: chloraprep       Needles:  Injection technique: Single-shot  Needle Type: Echogenic Needle     Needle Length: 9cm 9 cm Needle Gauge: 21 and 21 G    Additional Needles:  Procedures: ultrasound guided (picture in chart) Popliteal block Narrative:  Start time: 09/15/2014 2:53 PM End time: 09/15/2014 3:00 PM Injection made incrementally with aspirations every 5 mL.  Performed by: Personally  Anesthesiologist: Suzette Battiest   Procedure Name: LMA Insertion Date/Time: 09/15/2014 3:26 PM Performed by: Scheryl Darter Pre-anesthesia Checklist: Patient identified, Emergency Drugs available, Suction available, Patient being monitored and Timeout performed Patient Re-evaluated:Patient Re-evaluated prior to inductionOxygen Delivery Method: Circle system  utilized Preoxygenation: Pre-oxygenation with 100% oxygen Intubation Type: IV induction Ventilation: Mask ventilation without difficulty LMA: LMA inserted LMA Size: 5.0 Number of attempts: 1 Placement Confirmation: positive ETCO2 and breath sounds checked- equal and bilateral Tube secured with: Tape Dental Injury: Teeth and Oropharynx as per pre-operative assessment

## 2014-09-15 NOTE — Transfer of Care (Signed)
Immediate Anesthesia Transfer of Care Note  Patient: Walter Carter  Procedure(s) Performed: Procedure(s): ACHILLES TENDON REPAIR (Left)  Patient Location: PACU  Anesthesia Type:General and Regional  Level of Consciousness: awake, alert , oriented and sedated  Airway & Oxygen Therapy: Patient Spontanous Breathing and Patient connected to nasal cannula oxygen  Post-op Assessment: Report given to RN, Post -op Vital signs reviewed and stable and Patient moving all extremities  Post vital signs: Reviewed and stable  Last Vitals: There were no vitals filed for this visit.  Complications: No apparent anesthesia complications

## 2014-09-16 ENCOUNTER — Encounter (HOSPITAL_COMMUNITY): Payer: Self-pay | Admitting: Orthopedic Surgery

## 2016-05-19 IMAGING — DX DG ANKLE COMPLETE 3+V*L*
3 series · 3 of 3 positions shown · non-contrast
Comparison: None.

CLINICAL DATA: Posterior left ankle pain.

EXAM:
LEFT ANKLE COMPLETE - 3+ VIEW

[ankle ap]
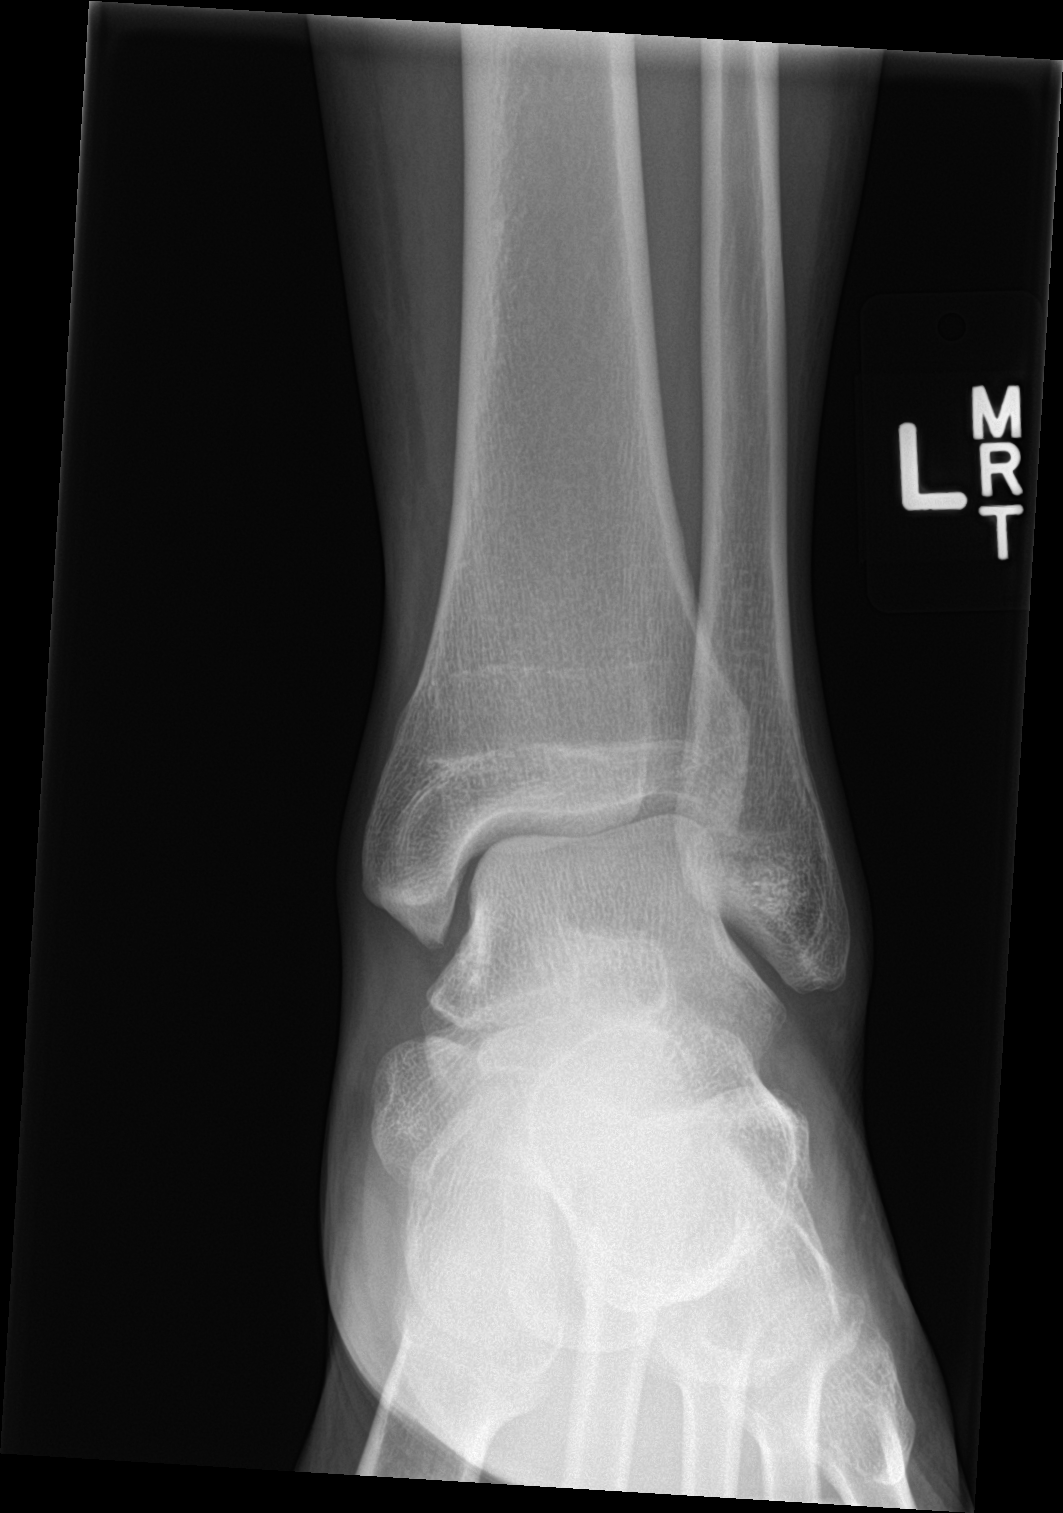

[ankle obl]
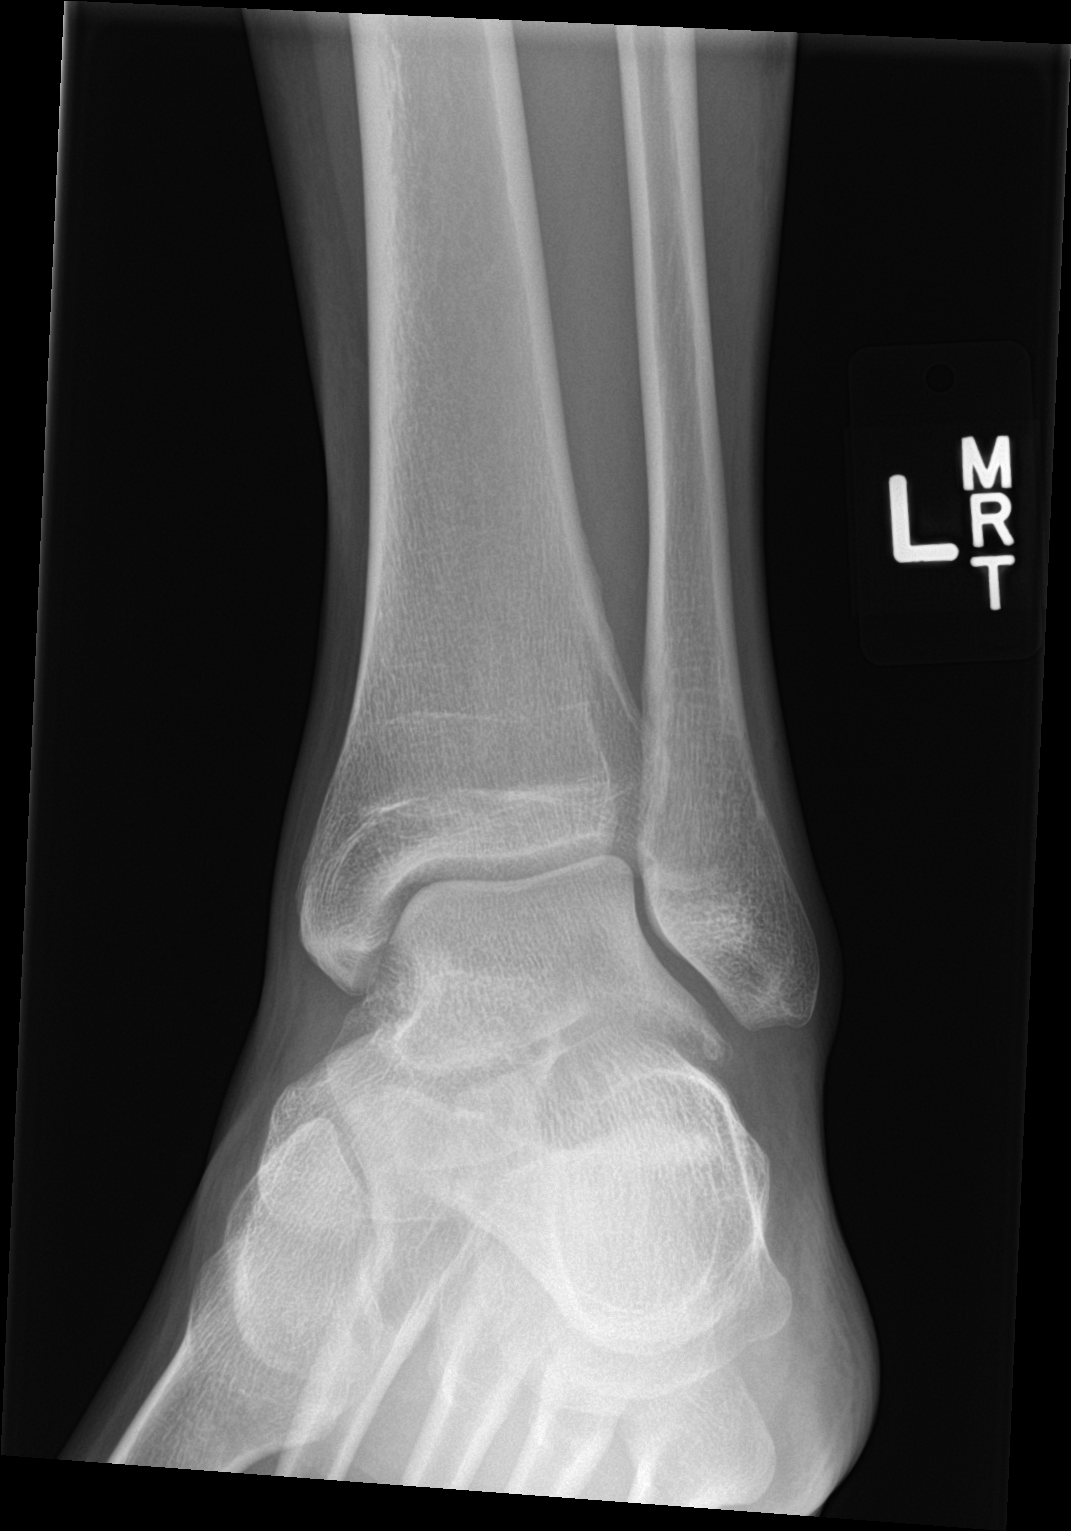

[ankle lat]
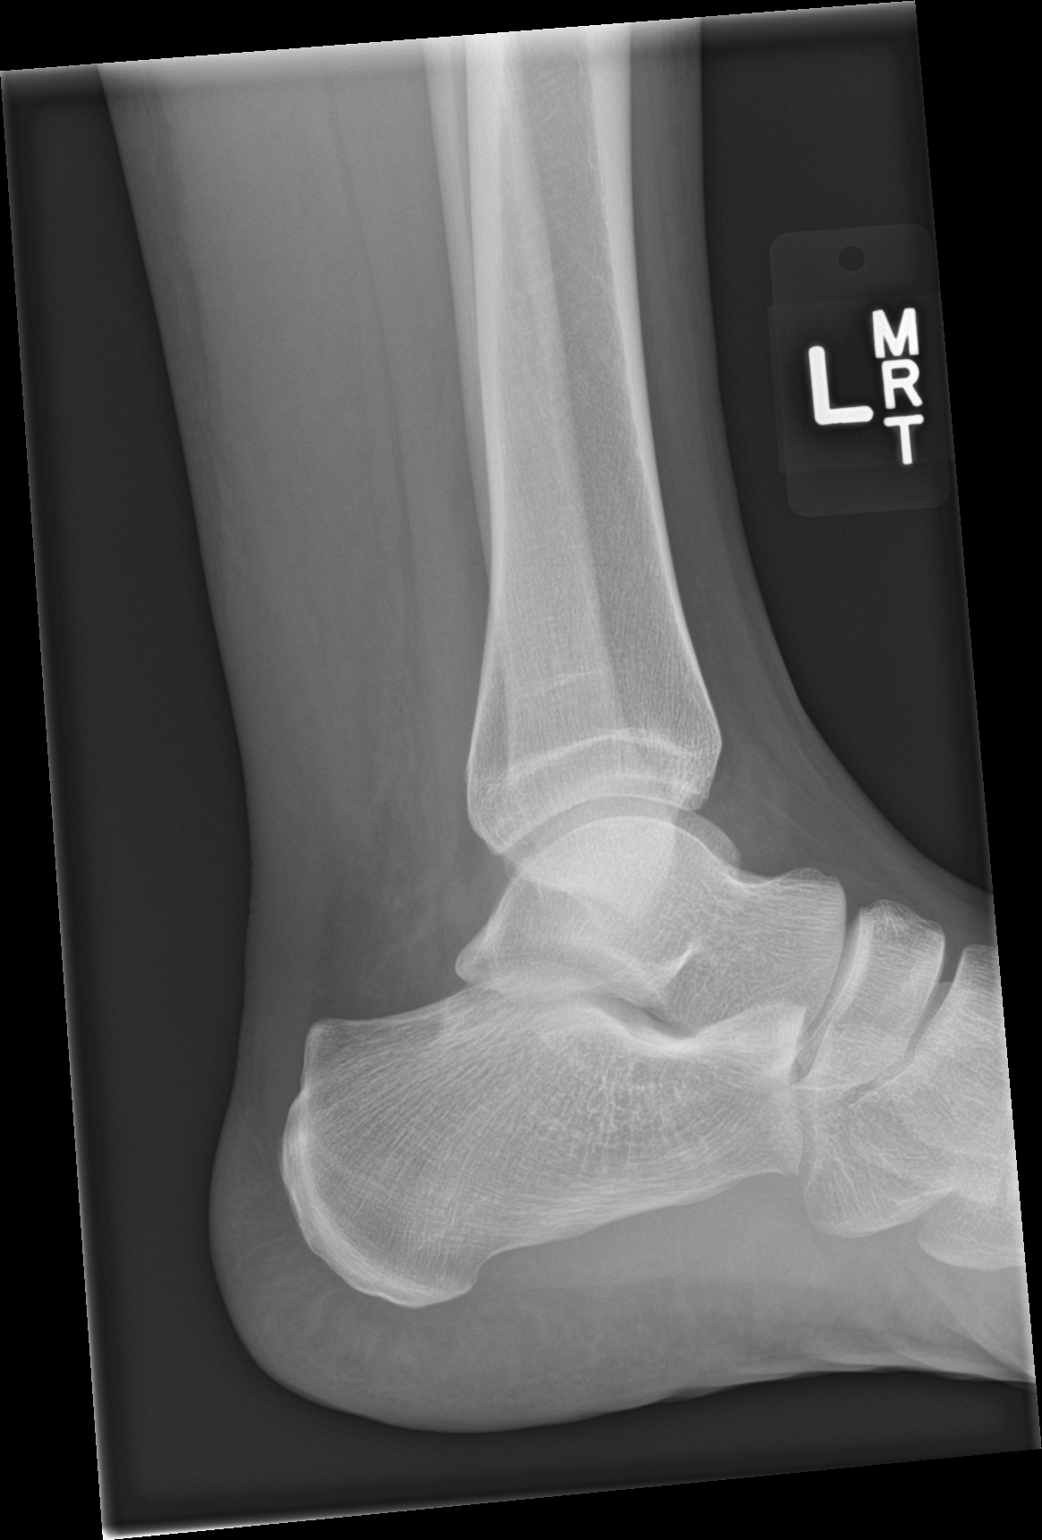

[3 of 3 positions shown; findings below may reference images not displayed]

FINDINGS: There is no evidence of fracture, dislocation, or joint effusion.
There is no evidence of arthropathy or other focal bone abnormality.
Soft tissues are unremarkable.
IMPRESSION: Negative.

## 2017-04-12 DIAGNOSIS — M25561 Pain in right knee: Secondary | ICD-10-CM | POA: Diagnosis not present

## 2017-04-12 DIAGNOSIS — M25562 Pain in left knee: Secondary | ICD-10-CM | POA: Insufficient documentation

## 2017-04-12 DIAGNOSIS — M222X1 Patellofemoral disorders, right knee: Secondary | ICD-10-CM | POA: Diagnosis not present

## 2017-04-12 DIAGNOSIS — M222X2 Patellofemoral disorders, left knee: Secondary | ICD-10-CM | POA: Insufficient documentation

## 2017-04-14 DIAGNOSIS — M25561 Pain in right knee: Secondary | ICD-10-CM | POA: Diagnosis not present

## 2017-04-14 DIAGNOSIS — M25562 Pain in left knee: Secondary | ICD-10-CM | POA: Diagnosis not present

## 2017-04-18 DIAGNOSIS — Z0000 Encounter for general adult medical examination without abnormal findings: Secondary | ICD-10-CM | POA: Diagnosis not present

## 2017-04-18 DIAGNOSIS — Z23 Encounter for immunization: Secondary | ICD-10-CM | POA: Diagnosis not present

## 2017-04-18 DIAGNOSIS — Z13220 Encounter for screening for lipoid disorders: Secondary | ICD-10-CM | POA: Diagnosis not present

## 2017-04-20 DIAGNOSIS — M25562 Pain in left knee: Secondary | ICD-10-CM | POA: Diagnosis not present

## 2017-04-20 DIAGNOSIS — M222X1 Patellofemoral disorders, right knee: Secondary | ICD-10-CM | POA: Diagnosis not present

## 2017-04-20 DIAGNOSIS — M25561 Pain in right knee: Secondary | ICD-10-CM | POA: Diagnosis not present

## 2017-04-24 DIAGNOSIS — M25562 Pain in left knee: Secondary | ICD-10-CM | POA: Diagnosis not present

## 2017-04-24 DIAGNOSIS — M222X1 Patellofemoral disorders, right knee: Secondary | ICD-10-CM | POA: Diagnosis not present

## 2017-04-24 DIAGNOSIS — M25561 Pain in right knee: Secondary | ICD-10-CM | POA: Diagnosis not present

## 2017-04-26 DIAGNOSIS — M222X1 Patellofemoral disorders, right knee: Secondary | ICD-10-CM | POA: Diagnosis not present

## 2017-04-26 DIAGNOSIS — M25561 Pain in right knee: Secondary | ICD-10-CM | POA: Diagnosis not present

## 2017-04-26 DIAGNOSIS — M25562 Pain in left knee: Secondary | ICD-10-CM | POA: Diagnosis not present

## 2017-05-04 DIAGNOSIS — M25561 Pain in right knee: Secondary | ICD-10-CM | POA: Diagnosis not present

## 2017-05-04 DIAGNOSIS — M222X1 Patellofemoral disorders, right knee: Secondary | ICD-10-CM | POA: Diagnosis not present

## 2017-05-04 DIAGNOSIS — M25562 Pain in left knee: Secondary | ICD-10-CM | POA: Diagnosis not present

## 2017-05-12 DIAGNOSIS — M25561 Pain in right knee: Secondary | ICD-10-CM | POA: Diagnosis not present

## 2017-05-12 DIAGNOSIS — M222X1 Patellofemoral disorders, right knee: Secondary | ICD-10-CM | POA: Diagnosis not present

## 2017-05-12 DIAGNOSIS — M25562 Pain in left knee: Secondary | ICD-10-CM | POA: Diagnosis not present

## 2017-11-02 DIAGNOSIS — M9902 Segmental and somatic dysfunction of thoracic region: Secondary | ICD-10-CM | POA: Diagnosis not present

## 2017-11-02 DIAGNOSIS — M542 Cervicalgia: Secondary | ICD-10-CM | POA: Diagnosis not present

## 2017-11-02 DIAGNOSIS — M9901 Segmental and somatic dysfunction of cervical region: Secondary | ICD-10-CM | POA: Diagnosis not present

## 2018-08-24 ENCOUNTER — Ambulatory Visit (HOSPITAL_COMMUNITY)
Admission: RE | Admit: 2018-08-24 | Discharge: 2018-08-24 | Disposition: A | Discharge status: Home / Self Care | Payer: 59 | Source: Ambulatory Visit | Attending: Medical | Admitting: Medical

## 2018-08-24 ENCOUNTER — Encounter (HOSPITAL_COMMUNITY): Payer: Self-pay

## 2018-08-24 ENCOUNTER — Inpatient Hospital Stay (HOSPITAL_COMMUNITY): Admit: 2018-08-24 | Payer: PRIVATE HEALTH INSURANCE

## 2018-08-24 ENCOUNTER — Other Ambulatory Visit (HOSPITAL_COMMUNITY): Payer: Self-pay | Admitting: Urgent Care

## 2018-08-24 ENCOUNTER — Telehealth (HOSPITAL_COMMUNITY): Payer: Self-pay | Admitting: Urgent Care

## 2018-08-24 ENCOUNTER — Other Ambulatory Visit: Payer: Self-pay

## 2018-08-24 ENCOUNTER — Ambulatory Visit (HOSPITAL_COMMUNITY)
Admission: EM | Admit: 2018-08-24 | Discharge: 2018-08-24 | Disposition: A | Discharge status: Home / Self Care | Payer: 59

## 2018-08-24 DIAGNOSIS — R609 Edema, unspecified: Secondary | ICD-10-CM | POA: Diagnosis not present

## 2018-08-24 DIAGNOSIS — M79661 Pain in right lower leg: Secondary | ICD-10-CM

## 2018-08-24 DIAGNOSIS — M79604 Pain in right leg: Secondary | ICD-10-CM

## 2018-08-24 MED ORDER — NAPROXEN 500 MG PO TABS: 500.0000 mg | ORAL_TABLET | Freq: Two times a day (BID) | ORAL | 0 refills | Status: DC

## 2018-08-24 NOTE — Telephone Encounter (Signed)
Received phone call confirming negative DVT study.  Counseled patient at his office visit today that we will use naproxen for pain and inflammation if his ultrasound study was negative.  I attempted to call patient but had to leave a voice message.  Follow-up if symptoms fail to improve.

## 2018-08-24 NOTE — ED Provider Notes (Signed)
  MRN: [id number] DOB: 05-02-1975  Subjective:   Walter Carter is a 43 y.o. male presenting for 2-day history of progressively worsening right lower leg pain, swelling.  Symptoms started after having a 20-hour round trip drive to [place].  He got back on Wednesday.  Would like to make sure he does not have a DVT.  Denies history of clotting disorder, DVT or PE.  Denies chronic medical conditions.   No current facility-administered medications for this encounter.   Current Outpatient Medications:  .  fish oil-omega-3 fatty acids 1000 MG capsule, Take 2 g by mouth daily., Disp: , Rfl:  .  Multiple Vitamin (MULTIVITAMIN) capsule, Take 1 capsule by mouth daily., Disp: , Rfl:     No Known Allergies   Past Medical History:  Diagnosis Date  . H/O urinary retention    s/t narcotics with prior surgery  . Wears glasses    alternates some with contacts     Past Surgical History:  Procedure Laterality Date  . ACHILLES TENDON SURGERY Left 09/15/2014   Procedure: ACHILLES TENDON REPAIR;  Surgeon: Newt Minion, MD;  Location: [facility];  Service: Orthopedics;  Laterality: Left;  . KNEE SURGERY  1988   right for patella fracture and osteophytic growths  . KNEE SURGERY  1991   right knee to remove hardware from prior surgery    ROS  Objective:   Vitals: BP (!) 145/83   Pulse 77   Temp 98 F (36.7 C)   Resp 18   SpO2 97%   Physical Exam Constitutional:      Appearance: Normal appearance. He is well-developed and normal weight.  HENT:     Head: Normocephalic and atraumatic.     Right Ear: External ear normal.     Left Ear: External ear normal.     Nose: Nose normal.     Mouth/Throat:     Pharynx: Oropharynx is clear.  Eyes:     Extraocular Movements: Extraocular movements intact.     Pupils: Pupils are equal, round, and reactive to light.  Cardiovascular:     Rate and Rhythm: Normal rate.  Pulmonary:     Effort: Pulmonary effort is normal.  Musculoskeletal:     Right  lower leg: He exhibits swelling. Edema present.  Neurological:     Mental Status: He is alert and oriented to person, place, and time.  Psychiatric:        Mood and Affect: Mood normal.        Behavior: Behavior normal.     Assessment and Plan :   1. Right calf pain   2. Right leg pain     We will pursue ultrasound of his right lower leg, our nursing staff secure an appointment at 2 PM today.  We will follow-up thereafter.   Jaynee Eagles, PA-C 08/24/18 1035

## 2018-08-24 NOTE — Progress Notes (Signed)
Right lower extremity venous duplex completed. Preliminary results in Chart review CV Proc. Walter Carter,RVS 08/24/2018, 2:35 PM

## 2018-08-24 NOTE — ED Triage Notes (Signed)
Pt presents with complaints of right calf pain since Wednesday. Reports driving to [place] on Tuesday and back Wednesday night, total hours of 29 traveling there and back. Concerned for DVT. CMS intact.

## 2018-08-24 NOTE — Discharge Instructions (Addendum)
Our nursing staff secured an ultrasound appointment for your right lower leg today at 2 PM.  Please report to the [facility] and ask for directions for an outpatient ultrasound.  They will direct you to the appropriate floor.

## 2018-08-24 NOTE — ED Notes (Signed)
Vascular contacted. appt for DVT study at 1400. Pt made aware and agreeable to plan.

## 2019-01-11 HISTORY — PX: SHOULDER SURGERY: SHX246

## 2020-11-04 DIAGNOSIS — Z8249 Family history of ischemic heart disease and other diseases of the circulatory system: Secondary | ICD-10-CM | POA: Insufficient documentation

## 2020-11-04 DIAGNOSIS — R0789 Other chest pain: Secondary | ICD-10-CM | POA: Insufficient documentation

## 2020-11-04 DIAGNOSIS — E7849 Other hyperlipidemia: Secondary | ICD-10-CM | POA: Insufficient documentation

## 2021-09-15 ENCOUNTER — Encounter: Payer: Self-pay | Admitting: Internal Medicine

## 2021-10-12 ENCOUNTER — Encounter: Payer: Self-pay | Admitting: Podiatry

## 2021-10-12 ENCOUNTER — Encounter: Payer: Self-pay | Admitting: Gastroenterology

## 2021-10-12 ENCOUNTER — Ambulatory Visit (INDEPENDENT_AMBULATORY_CARE_PROVIDER_SITE_OTHER): Payer: 59

## 2021-10-12 ENCOUNTER — Ambulatory Visit: Payer: 59 | Admitting: Podiatry

## 2021-10-12 DIAGNOSIS — M7750 Other enthesopathy of unspecified foot: Secondary | ICD-10-CM

## 2021-10-12 DIAGNOSIS — M7661 Achilles tendinitis, right leg: Secondary | ICD-10-CM | POA: Diagnosis not present

## 2021-10-12 DIAGNOSIS — M62461 Contracture of muscle, right lower leg: Secondary | ICD-10-CM

## 2021-10-12 DIAGNOSIS — M7751 Other enthesopathy of right foot: Secondary | ICD-10-CM

## 2021-10-12 MED ORDER — IBUPROFEN 800 MG PO TABS: 800.0000 mg | ORAL_TABLET | Freq: Three times a day (TID) | ORAL | 0 refills | Status: AC | PRN

## 2021-10-12 MED ORDER — IBUPROFEN 800 MG PO TABS: 800.0000 mg | ORAL_TABLET | Freq: Three times a day (TID) | ORAL | 0 refills | Status: DC | PRN

## 2021-10-12 NOTE — Patient Instructions (Addendum)

## 2021-10-14 NOTE — Progress Notes (Signed)
  Subjective:  Patient ID: Walter Carter, male    DOB: 01-27-1975,  MRN: [id number]  Chief Complaint  Patient presents with   Tendonitis    NP  achilles pain right lower extremity    46 y.o. male presents with the above complaint. History confirmed with patient.  Is been present for a couple of months its been getting worse.  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 Left Foot: normal exam, no swelling, tenderness, instability; ligaments intact, full range of motion of all ankle/foot joints Right Foot: tenderness at Achilles tendon insertion and gastrocnemius equinus is noted with a positive silverskiold test    Radiographs: Multiple views x-ray of the right foot: no fracture, dislocation, swelling or degenerative changes noted, plantar calcaneal spur, and Haglund deformity noted Assessment:   1. Achilles tendinitis, right leg      Plan:  Patient was evaluated and treated and all questions answered.  Discussed the etiology and treatment options for Achilles tendinitis including stretching, formal physical therapy with an eccentric exercises therapy plan, supportive shoegears such as a running shoe or sneaker, heel lifts, topical and oral medications.  We also discussed that I do not routinely perform injections in this area because of the risk of an increased damage or rupture of the tendon.  We also discussed the role of surgical treatment of this for patients who do not improve after exhausting non-surgical treatment options.  -XR reviewed with patient -Educated on stretching and icing of the affected limb. -Night splint dispensed. -Rx for ibuprofen 800 mg.  He had a negative reaction to meloxicam previously. Advised on risks, benefits, and alternatives of the medication   Return in about 6 weeks (around 11/23/2021) for  re-check Achilles tendon.

## 2021-10-19 ENCOUNTER — Encounter: Payer: Self-pay | Admitting: Internal Medicine

## 2021-10-25 ENCOUNTER — Ambulatory Visit (AMBULATORY_SURGERY_CENTER): Payer: Self-pay

## 2021-10-25 VITALS — Ht 72.0 in | Wt 243.0 lb

## 2021-10-25 DIAGNOSIS — Z1211 Encounter for screening for malignant neoplasm of colon: Secondary | ICD-10-CM

## 2021-10-25 MED ORDER — NA SULFATE-K SULFATE-MG SULF 17.5-3.13-1.6 GM/177ML PO SOLN: 1.0000 | Freq: Once | ORAL | 0 refills | Status: AC

## 2021-10-25 NOTE — Progress Notes (Signed)
No egg or soy allergy known to patient  No issues known to pt with past sedation with any surgeries or procedures Patient denies ever being told they had issues or difficulty with intubation  No FH of Malignant Hyperthermia Pt is not on diet pills Pt is not on  home 02  Pt is not on blood thinners  Pt denies issues with constipation  No A fib or A flutter Have any cardiac testing pending--no Pt instructed to use Single[employer] or GoodRx for a price reduction on prep   

## 2021-11-10 ENCOUNTER — Encounter: Payer: Self-pay | Admitting: Gastroenterology

## 2021-11-18 ENCOUNTER — Ambulatory Visit (AMBULATORY_SURGERY_CENTER): Payer: 59 | Admitting: Gastroenterology

## 2021-11-18 ENCOUNTER — Encounter: Payer: Self-pay | Admitting: Gastroenterology

## 2021-11-18 VITALS — BP 127/74 | HR 70 | Temp 97.3°F | Resp 14 | Ht 72.0 in | Wt 243.0 lb

## 2021-11-18 DIAGNOSIS — D123 Benign neoplasm of transverse colon: Secondary | ICD-10-CM

## 2021-11-18 DIAGNOSIS — Z1211 Encounter for screening for malignant neoplasm of colon: Secondary | ICD-10-CM

## 2021-11-18 MED ORDER — SODIUM CHLORIDE 0.9 % IV SOLN: 500.0000 mL | Freq: Once | INTRAVENOUS | Status: DC

## 2021-11-18 NOTE — Patient Instructions (Signed)
Handouts Provided:  Polyps  YOU HAD AN ENDOSCOPIC PROCEDURE TODAY AT THE [facility]:   Refer to the procedure report that was given to you for any specific questions about what was found during the examination.  If the procedure report does not answer your questions, please call your gastroenterologist to clarify.  If you requested that your care partner not be given the details of your procedure findings, then the procedure report has been included in a sealed envelope for you to review at your convenience later.  YOU SHOULD EXPECT: Some feelings of bloating in the abdomen. Passage of more gas than usual.  Walking can help get rid of the air that was put into your GI tract during the procedure and reduce the bloating. If you had a lower endoscopy (such as a colonoscopy or flexible sigmoidoscopy) you may notice spotting of blood in your stool or on the toilet paper. If you underwent a bowel prep for your procedure, you may not have a normal bowel movement for a few days.  Please Note:  You might notice some irritation and congestion in your nose or some drainage.  This is from the oxygen used during your procedure.  There is no need for concern and it should clear up in a day or so.  SYMPTOMS TO REPORT IMMEDIATELY:  Following lower endoscopy (colonoscopy or flexible sigmoidoscopy):  Excessive amounts of blood in the stool  Significant tenderness or worsening of abdominal pains  Swelling of the abdomen that is new, acute  Fever of 100F or higher  For urgent or emergent issues, a gastroenterologist can be reached at any hour by calling [phone]. Do not use MyChart messaging for urgent concerns.    DIET:  We do recommend a small meal at first, but then you may proceed to your regular diet.  Drink plenty of fluids but you should avoid alcoholic beverages for 24 hours.  ACTIVITY:  You should plan to take it easy for the rest of today and you should NOT DRIVE or use heavy  machinery until tomorrow (because of the sedation medicines used during the test).    FOLLOW UP: Our staff will call the number listed on your records the next business day following your procedure.  We will call around 7:15- 8:00 am to check on you and address any questions or concerns that you may have regarding the information given to you following your procedure. If we do not reach you, we will leave a message.     If any biopsies were taken you will be contacted by phone or by letter within the next 1-3 weeks.  Please call us at [phone] if you have not heard about the biopsies in 3 weeks.    SIGNATURES/CONFIDENTIALITY: You and/or your care partner have signed paperwork which will be entered into your electronic medical record.  These signatures attest to the fact that that the information above on your After Visit Summary has been reviewed and is understood.  Full responsibility of the confidentiality of this discharge information lies with you and/or your care-partner.  

## 2021-11-18 NOTE — Progress Notes (Signed)
Report to PACU, RN, vss, BBS= Clear.  

## 2021-11-18 NOTE — Progress Notes (Signed)
Pt's states no medical or surgical changes since previsit or office visit. 

## 2021-11-18 NOTE — Op Note (Signed)
[place] Patient Name: Walter Carter Procedure Date: 11/18/2021 7:51 AM MRN: [id number] Endoscopist: [place]. Candis Schatz , MD, [id number] Age: 46 Referring MD:  Date of Birth: May 30, 1975 Gender: Male Account #: [id number] Procedure:                Colonoscopy Indications:              Screening for colorectal malignant neoplasm, This                            is the patient's first colonoscopy Medicines:                Monitored Anesthesia Care Procedure:                Pre-Anesthesia Assessment:                           - Prior to the procedure, a History and Physical                            was performed, and patient medications and                            allergies were reviewed. The patient's tolerance of                            previous anesthesia was also reviewed. The risks                            and benefits of the procedure and the sedation                            options and risks were discussed with the patient.                            All questions were answered, and informed consent                            was obtained. Prior Anticoagulants: The patient has                            taken no anticoagulant or antiplatelet agents. ASA                            Grade Assessment: II - A patient with mild systemic                            disease. After reviewing the risks and benefits,                            the patient was deemed in satisfactory condition to                            undergo the procedure.  After obtaining informed consent, the colonoscope                            was passed under direct vision. Throughout the                            procedure, the patient's blood pressure, pulse, and                            oxygen saturations were monitored continuously. The                            Olympus CF-HQ190L (Serial# [id number]) Colonoscope was                            introduced through  the anus and advanced to the the                            terminal ileum, with identification of the                            appendiceal orifice and IC valve. The colonoscopy                            was performed without difficulty. The patient                            tolerated the procedure well. The quality of the                            bowel preparation was good. The terminal ileum,                            ileocecal valve, appendiceal orifice, and rectum                            were photographed. The bowel preparation used was                            SUPREP via split dose instruction. Scope In: 7:59:10 AM Scope Out: 8:15:27 AM Scope Withdrawal Time: 0 hours 12 minutes 50 seconds  Total Procedure Duration: 0 hours 16 minutes 17 seconds  Findings:                 The perianal and digital rectal examinations were                            normal. Pertinent negatives include normal                            sphincter tone and no palpable rectal lesions.                           A 4 mm polyp was found in the transverse colon. The  polyp was sessile. The polyp was removed with a                            cold snare. Resection and retrieval were complete.                            The pathology specimen was placed into Bottle                            Number 1. Estimated blood loss was minimal.                           Two medium-mouthed diverticula were found in the                            cecum.                           The exam was otherwise normal throughout the                            examined colon.                           The terminal ileum appeared normal.                           The retroflexed view of the distal rectum and anal                            verge was normal and showed no anal or rectal                            abnormalities. Complications:            No immediate complications. Estimated Blood  Loss:     Estimated blood loss was minimal. Impression:               - One 4 mm polyp in the transverse colon, removed                            with a cold snare. Resected and retrieved.                           - Diverticulosis in the cecum.                           - The examined portion of the ileum was normal.                           - The distal rectum and anal verge are normal on                            retroflexion view. Recommendation:           - Patient has a contact number available  for                            emergencies. The signs and symptoms of potential                            delayed complications were discussed with the                            patient. Return to normal activities tomorrow.                            Written discharge instructions were provided to the                            patient.                           - Resume previous diet.                           - Continue present medications.                           - Await pathology results.                           - Repeat colonoscopy (date not yet determined) for                            surveillance based on pathology results. Gracyn Santillanes E. Candis Schatz, MD 11/18/2021 8:26:20 AM This report has been signed electronically.

## 2021-11-18 NOTE — Progress Notes (Signed)
[facility] History and Physical   Primary Care Physician:  Sue Lush, PA-C   Reason for Procedure:   Colon cancer screening  Plan:    Screening colonoscopy     HPI: Walter Carter is a 46 y.o. male undergoing initial average risk screening colonoscopy.  He has no family history of colon cancer and no chronic GI symptoms.    Past Medical History:  Diagnosis Date   H/O urinary retention    s/t narcotics with prior surgery   Wears glasses    alternates some with contacts    Past Surgical History:  Procedure Laterality Date   ACHILLES TENDON SURGERY Left 09/15/2014   Procedure: ACHILLES TENDON REPAIR;  Surgeon: Newt Minion, MD;  Location: [facility];  Service: Orthopedics;  Laterality: Left;   KNEE SURGERY  01/10/1986   right for patella fracture and osteophytic growths   KNEE SURGERY  01/10/1989   right knee to remove hardware from prior surgery   SHOULDER ARTHROSCOPY W/ LABRAL REPAIR Left 2014   SHOULDER SURGERY Right 2021   bone spur    Prior to Admission medications   Medication Sig Start Date End Date Taking? Authorizing Provider  augmented betamethasone dipropionate (DIPROLENE-AF) 0.05 % cream Apply topically 2 (two) times daily. 10/07/21  Yes [provider]  Multiple Vitamin (MULTIVITAMIN) capsule Take 1 capsule by mouth daily.   Yes [provider]  fish oil-omega-3 fatty acids 1000 MG capsule Take 2 g by mouth daily.    [provider]  oxyCODONE (OXY IR/ROXICODONE) 5 MG immediate release tablet TAKE 1-2 TABLETS (5-10 MG TOTAL) BY MOUTH EVERY 6 (SIX) HOURS AS NEEDED FOR UP TO 5 DAYS.    [provider]    Current Outpatient Medications  Medication Sig Dispense Refill   augmented betamethasone dipropionate (DIPROLENE-AF) 0.05 % cream Apply topically 2 (two) times daily.     Multiple Vitamin (MULTIVITAMIN) capsule Take 1 capsule by mouth daily.     fish oil-omega-3 fatty acids 1000 MG capsule Take 2 g by mouth  daily.     oxyCODONE (OXY IR/ROXICODONE) 5 MG immediate release tablet TAKE 1-2 TABLETS (5-10 MG TOTAL) BY MOUTH EVERY 6 (SIX) HOURS AS NEEDED FOR UP TO 5 DAYS.     Current Facility-Administered Medications  Medication Dose Route Frequency Provider Last Rate Last Admin   0.9 %  sodium chloride infusion  500 mL Intravenous Once Daryel November, MD        Allergies as of 11/18/2021   (No Known Allergies)    Family History  Problem Relation Age of Onset   Hypertension Mother    Heart disease Mother 63       ASD, stenting   Other Father        rare hematology disorder, unknown?   Heart disease Maternal Uncle    Stroke Maternal Grandmother    Cancer Maternal Grandmother        breast   Diabetes Maternal Grandfather    Heart disease Maternal Grandfather    Dementia Paternal Grandmother    Colon cancer Neg Hx    Colon polyps Neg Hx    Esophageal cancer Neg Hx    Rectal cancer Neg Hx    Stomach cancer Neg Hx     Social History   Socioeconomic History   Marital status: Divorced    Spouse name: Not on file   Number of children: Not on file   Years of education: Not on file  Highest education level: Not on file  Occupational History   Occupation: nurse    Employer: [employer]  Tobacco Use   Smoking status: Never   Smokeless tobacco: Never  Substance and Sexual Activity   Alcohol use: Not Currently    Alcohol/week: 1.0 standard drink of alcohol    Types: 1 Standard drinks or equivalent per week   Drug use: Never   Sexual activity: Not Currently  Other Topics Concern   Not on file  Social History Narrative   Graduated 2013 [employer] for nursing.  Prior worked in [place].  Single, divorced, has girlfriend x 9 years, has 4 children.  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  Exercise - jogging some.  Was exercising more before the left shoulder injury.   Social Determinants of Health   Financial Resource Strain: Not on file   Food Insecurity: Not on file  Transportation Needs: Not on file  Physical Activity: Not on file  Stress: Not on file  Social Connections: Not on file  Intimate Partner Violence: Not on file    Review of Systems:  All other review of systems negative except as mentioned in the HPI.  Physical Exam: Vital signs BP 130/86   Pulse 70   Temp (!) 97.3 F (36.3 C) (Temporal)   Ht 6' (1.829 m)   Wt 243 lb (110.2 kg)   SpO2 98%   BMI 32.96 kg/m   General:   Alert,  Well-developed, well-nourished, pleasant and cooperative in NAD Airway:  Mallampati 2 Lungs:  Clear throughout to auscultation.   Heart:  Regular rate and rhythm; no murmurs, clicks, rubs,  or gallops. Abdomen:  Soft, nontender and nondistended. Normal bowel sounds.   Neuro/Psych:  Normal mood and affect. A and O x 3   Miria Cappelli E. Candis Schatz, MD [facility]

## 2021-11-18 NOTE — Progress Notes (Signed)
Called to room to assist during endoscopic procedure.  Patient ID and intended procedure confirmed with present staff. Received instructions for my participation in the procedure from the performing physician.Called to room to assist during endoscopic procedure.  Patient ID and intended procedure confirmed with present staff. Received instructions for my participation in the procedure from the performing physician. 

## 2021-11-19 ENCOUNTER — Telehealth: Payer: Self-pay

## 2021-11-19 ENCOUNTER — Encounter: Payer: Self-pay | Admitting: Podiatry

## 2021-11-19 NOTE — Telephone Encounter (Signed)
  Follow up Call-     11/18/2021    7:08 AM  Call back number  Post procedure Call Back phone  # [phone] Walter Carter  Permission to leave phone message Yes     Patient questions:  Do you have a fever, pain , or abdominal swelling? No. Pain Score  0 *  Have you tolerated food without any problems? Yes.    Have you been able to return to your normal activities? Yes.    Do you have any questions about your discharge instructions: Diet   No. Medications  No. Follow up visit  No.  Do you have questions or concerns about your Care? No.  Actions: * If pain score is 4 or above: No action needed, pain <4.

## 2021-11-23 ENCOUNTER — Ambulatory Visit: Payer: 59 | Admitting: Podiatry

## 2021-11-25 ENCOUNTER — Ambulatory Visit: Payer: 59 | Admitting: Podiatry

## 2021-11-25 DIAGNOSIS — M7661 Achilles tendinitis, right leg: Secondary | ICD-10-CM

## 2021-11-25 DIAGNOSIS — M62461 Contracture of muscle, right lower leg: Secondary | ICD-10-CM

## 2021-11-25 NOTE — Patient Instructions (Signed)

## 2021-11-25 NOTE — Progress Notes (Signed)
Walter Carter,  The polyp which I removed during your recent procedure was proven to be completely benign but is considered a "pre-cancerous" polyp that MAY have grown into cancer if it had not been removed.  Studies shows that at least 20% of women over age 46 and 30% of men over age 88 have pre-cancerous polyps.  Based on current nationally recognized surveillance guidelines, I recommend that you have a repeat colonoscopy in 7 years.   If you develop any new rectal bleeding, abdominal pain or significant bowel habit changes, please contact me before then.

## 2021-11-29 ENCOUNTER — Encounter: Payer: Self-pay | Admitting: Podiatry

## 2021-11-29 NOTE — Progress Notes (Signed)
  Subjective:  Patient ID: Walter Carter, male    DOB: February 08, 1975,  MRN: [id number]  Chief Complaint  Patient presents with   Follow-up    6 week follow-up achilles tendon right foot.    46 y.o. male presents with the above complaint. History confirmed with patient.  Has improved somewhat but still fairly painful  Objective:  Physical Exam: warm, good capillary refill, no trophic changes or ulcerative lesions, normal DP and PT pulses, and normal sensory exam. Left Foot: normal exam, no swelling, tenderness, instability; ligaments intact, full range of motion of all ankle/foot joints Right Foot: tenderness at Achilles tendon insertion and gastrocnemius equinus is noted with a positive silverskiold test    Radiographs: Multiple views x-ray of the right foot: no fracture, dislocation, swelling or degenerative changes noted, plantar calcaneal spur, and Haglund deformity noted Assessment:   1. Achilles tendinitis, right leg   2. Gastrocnemius equinus of right lower extremity      Plan:  Patient was evaluated and treated and all questions answered.  Discussed the etiology and treatment options for Achilles tendinitis including stretching, formal physical therapy with an eccentric exercises therapy plan, supportive shoegears such as a running shoe or sneaker, heel lifts, topical and oral medications.  We also discussed that I do not routinely perform injections in this area because of the risk of an increased damage or rupture of the tendon.  We also discussed the role of surgical treatment of this for patients who do not improve after exhausting non-surgical treatment options.    --I recommend formal physical therapy and referral to [facility] was sent -Recommend he increase to CAM boot immobilization he has this at home -Continue using night splint -Continue ibuprofen 800 mg as needed   Return in about 6 weeks (around 01/06/2022) for re-check Achilles tendon.

## 2022-01-06 ENCOUNTER — Ambulatory Visit: Payer: 59 | Admitting: Podiatry

## 2022-01-06 DIAGNOSIS — M7661 Achilles tendinitis, right leg: Secondary | ICD-10-CM

## 2022-01-06 NOTE — Progress Notes (Signed)
  Subjective:  Patient ID: Walter Carter, male    DOB: September 17, 1975,  MRN: [id number]  Chief Complaint  Patient presents with   Tendonitis    Follow up achilles tendonitis right    46 y.o. male presents with the above complaint. History confirmed with patient.  He has had quite a bit of improvement still some tenderness in the mid point  Objective:  Physical Exam: warm, good capillary refill, no trophic changes or ulcerative lesions, normal DP and PT pulses, and normal sensory exam. Left Foot: normal exam, no swelling, tenderness, instability; ligaments intact, full range of motion of all ankle/foot joints Right Foot: Mild tenderness in the in mid substance of the Achilles, none at the insertion out, equinus still present    Radiographs: Multiple views x-ray of the right foot: no fracture, dislocation, swelling or degenerative changes noted, plantar calcaneal spur, and Haglund deformity noted Assessment:   1. Achilles tendinitis, right leg       Plan:  Patient was evaluated and treated and all questions answered.  So far has had some improvement and should be able to continue with home PT at this point.  Continues a night splint, advised him if he can use it all night and then to use it for 1 to 2 hours prior to bed.  Can begin to increase exercise and activity begin with low impact or not impact exercise.  Discussed if it is worsening to let me know and we will order MRI to follow-up  Return if symptoms worsen or fail to improve.
